# Patient Record
Sex: Male | Born: 1950 | Race: Black or African American | Hispanic: No | Marital: Single | Smoking: Never smoker
Health system: Southern US, Community
[De-identification: ages and names within clinical notes are randomized; demographics above are authoritative.]

## PROBLEM LIST (undated history)

## (undated) DIAGNOSIS — E119 Type 2 diabetes mellitus without complications: Secondary | ICD-10-CM

## (undated) DIAGNOSIS — I639 Cerebral infarction, unspecified: Secondary | ICD-10-CM

---

## 2016-11-02 ENCOUNTER — Emergency Department (HOSPITAL_COMMUNITY): Payer: Self-pay

## 2016-11-02 ENCOUNTER — Inpatient Hospital Stay (HOSPITAL_COMMUNITY)
Admission: EM | Admit: 2016-11-02 | Discharge: 2016-11-06 | DRG: 684 | Disposition: A | Discharge status: Home / Self Care | Payer: Self-pay | Attending: Internal Medicine | Admitting: Internal Medicine

## 2016-11-02 ENCOUNTER — Encounter (HOSPITAL_COMMUNITY): Payer: Self-pay | Admitting: Emergency Medicine

## 2016-11-02 DIAGNOSIS — Z888 Allergy status to other drugs, medicaments and biological substances status: Secondary | ICD-10-CM

## 2016-11-02 DIAGNOSIS — E119 Type 2 diabetes mellitus without complications: Secondary | ICD-10-CM | POA: Diagnosis present

## 2016-11-02 DIAGNOSIS — N429 Disorder of prostate, unspecified: Secondary | ICD-10-CM | POA: Diagnosis present

## 2016-11-02 DIAGNOSIS — K5900 Constipation, unspecified: Secondary | ICD-10-CM

## 2016-11-02 DIAGNOSIS — R112 Nausea with vomiting, unspecified: Secondary | ICD-10-CM

## 2016-11-02 DIAGNOSIS — E876 Hypokalemia: Secondary | ICD-10-CM | POA: Diagnosis present

## 2016-11-02 DIAGNOSIS — Z6821 Body mass index (BMI) 21.0-21.9, adult: Secondary | ICD-10-CM

## 2016-11-02 DIAGNOSIS — N179 Acute kidney failure, unspecified: Principal | ICD-10-CM

## 2016-11-02 DIAGNOSIS — R634 Abnormal weight loss: Secondary | ICD-10-CM

## 2016-11-02 DIAGNOSIS — I10 Essential (primary) hypertension: Secondary | ICD-10-CM | POA: Diagnosis present

## 2016-11-02 DIAGNOSIS — Z79899 Other long term (current) drug therapy: Secondary | ICD-10-CM

## 2016-11-02 DIAGNOSIS — N400 Enlarged prostate without lower urinary tract symptoms: Secondary | ICD-10-CM

## 2016-11-02 DIAGNOSIS — Z8673 Personal history of transient ischemic attack (TIA), and cerebral infarction without residual deficits: Secondary | ICD-10-CM

## 2016-11-02 DIAGNOSIS — R630 Anorexia: Secondary | ICD-10-CM

## 2016-11-02 DIAGNOSIS — E860 Dehydration: Secondary | ICD-10-CM

## 2016-11-02 HISTORY — DX: Type 2 diabetes mellitus without complications: E11.9

## 2016-11-02 HISTORY — DX: Cerebral infarction, unspecified: I63.9

## 2016-11-02 LAB — COMPREHENSIVE METABOLIC PANEL
ALT: 17 U/L (ref 17–63)
AST: 17 U/L (ref 15–41)
Albumin: 4.5 g/dL (ref 3.5–5.0)
Alkaline Phosphatase: 88 U/L (ref 38–126)
Anion gap: 16 — ABNORMAL HIGH (ref 5–15)
BUN: 28 mg/dL — ABNORMAL HIGH (ref 6–20)
CHLORIDE: 97 mmol/L — AB (ref 101–111)
CO2: 25 mmol/L (ref 22–32)
Calcium: 11.1 mg/dL — ABNORMAL HIGH (ref 8.9–10.3)
Creatinine, Ser: 1.64 mg/dL — ABNORMAL HIGH (ref 0.61–1.24)
GFR calc Af Amer: 49 mL/min — ABNORMAL LOW (ref >60)
GFR, EST NON AFRICAN AMERICAN: 43 mL/min — AB (ref >60)
Glucose, Bld: 118 mg/dL — ABNORMAL HIGH (ref 65–99)
POTASSIUM: 3.5 mmol/L (ref 3.5–5.1)
SODIUM: 138 mmol/L (ref 135–145)
Total Bilirubin: 1.3 mg/dL — ABNORMAL HIGH (ref 0.3–1.2)
Total Protein: 8.5 g/dL — ABNORMAL HIGH (ref 6.5–8.1)

## 2016-11-02 LAB — CBC
HEMATOCRIT: 39.5 % (ref 39.0–52.0)
Hemoglobin: 14.6 g/dL (ref 13.0–17.0)
MCH: 29.6 pg (ref 26.0–34.0)
MCHC: 37 g/dL — ABNORMAL HIGH (ref 30.0–36.0)
MCV: 80.1 fL (ref 78.0–100.0)
Platelets: 282 10*3/uL (ref 150–400)
RBC: 4.93 MIL/uL (ref 4.22–5.81)
RDW: 13.7 % (ref 11.5–15.5)
WBC: 8.2 10*3/uL (ref 4.0–10.5)

## 2016-11-02 LAB — I-STAT TROPONIN, ED: Troponin i, poc: 0 ng/mL (ref 0.00–0.08)

## 2016-11-02 LAB — LIPASE, BLOOD: Lipase: 29 U/L (ref 11–51)

## 2016-11-02 LAB — I-STAT CG4 LACTIC ACID, ED: Lactic Acid, Venous: 1.89 mmol/L (ref 0.5–1.9)

## 2016-11-02 MED ORDER — ONDANSETRON HCL 4 MG/2ML IJ SOLN
4.0000 mg | Freq: Once | INTRAMUSCULAR | Status: AC
  Administered 2016-11-02: 4 mg via INTRAVENOUS
  Filled 2016-11-02: qty 2

## 2016-11-02 MED ORDER — SODIUM CHLORIDE 0.9 % IV BOLUS (SEPSIS)
1000.0000 mL | Freq: Once | INTRAVENOUS | Status: AC
  Administered 2016-11-02: 1000 mL via INTRAVENOUS

## 2016-11-02 MED ORDER — IOPAMIDOL (ISOVUE-300) INJECTION 61%
INTRAVENOUS | Status: AC
  Administered 2016-11-02: 75 mL via INTRAVENOUS
  Filled 2016-11-02: qty 75

## 2016-11-02 NOTE — ED Notes (Signed)
Pt has not voided yet

## 2016-11-02 NOTE — ED Notes (Signed)
Provided pt with a urinal 

## 2016-11-02 NOTE — ED Provider Notes (Signed)
[facility]-EMERGENCY DEPT Provider Note   CSN: [id number] Arrival date & time: 11/02/16  2103     History   Chief Complaint Chief Complaint  Patient presents with  . Emesis    HPI Taylor Scott is a 66 y.o. male with a PMHx of HTN, HLD, DM2, and recent CVA (05/19/16, at [facility] who presents to the ED with complaints of nausea and vomiting that began at 8 PM after he had a drink of cold water. Patient states prior to that he was feeling fine. He states that the emesis was nonbloody and nonbilious. No known aggravating factors, no treatments tried prior to arrival. Later on during the evaluation, patient's family arrived and informed me that he has not had much of an appetite over the last several months ever since his CVA, and has had occasional nausea and vomiting for at least the last several weeks. Patient states that it's been about 2 weeks since he has not had an appetite, and he has not had a bowel movement in 3 weeks due to poor by mouth intake. He reports a 40 pound weight loss since April. He states that he didn't like how his medications made him feel so 2 weeks ago he stopped taking them. His medications include metformin  BID, losartan  QD, and atorvastatin  QHS. His PCP is Dr. Minda Meo at [facility].   He denies fevers, chills, night sweats, CP, SOB, abd pain, diarrhea, obstipation, melena, hematochezia, hematemesis, hematuria, dysuria, myalgias, arthralgias, numbness, tingling, focal weakness, or any other complaints at this time. Denies recent travel, sick contacts, suspicious food intake, EtOH use, NSAID use, or prior abd surgeries.    The history is provided by the patient, medical records and a relative. No language interpreter was used.  Emesis   This is a new problem. The current episode started 3 to 5 hours ago. Episode frequency: once. The problem has not changed since onset.The emesis has an appearance of stomach contents. There has been no fever. Pertinent  negatives include no abdominal pain, no arthralgias, no chills, no diarrhea, no fever and no myalgias.    Past Medical History:  Diagnosis Date  . Diabetes mellitus without complication (HCC)   . Stroke [facility])     There are no active problems to display for this patient.   History reviewed. No pertinent surgical history.     Home Medications    Prior to Admission medications   Not on File    Family History History reviewed. No pertinent family history.  Social History Social History  Substance Use Topics  . Smoking status: Never Smoker  . Smokeless tobacco: Never Used  . Alcohol use No     Allergies   Lisinopril   Review of Systems Review of Systems  Constitutional: Positive for appetite change and unexpected weight change. Negative for chills and fever.  Respiratory: Negative for shortness of breath.   Cardiovascular: Negative for chest pain.  Gastrointestinal: Positive for constipation, nausea and vomiting. Negative for abdominal pain, anal bleeding, blood in stool and diarrhea.  Genitourinary: Negative for dysuria and hematuria.  Musculoskeletal: Negative for arthralgias and myalgias.  Skin: Negative for color change.  Allergic/Immunologic: Positive for immunocompromised state (DM2).  Neurological: Negative for weakness and numbness.  Psychiatric/Behavioral: Negative for confusion.   All other systems reviewed and are negative for acute change except as noted in the HPI.    Physical Exam Updated Vital Signs BP (!) 118/91 (BP Location: Left Arm)   Pulse 87  Temp 98.1 F (36.7 C) (Oral)   Resp 18   SpO2 96%   Physical Exam  Constitutional: He is oriented to person, place, and time. Vital signs are normal. He appears well-developed. He appears cachectic.  Non-toxic appearance. No distress.  Afebrile, nontoxic, NAD, appears older than stated age, cachetic appearing  HENT:  Head: Normocephalic and atraumatic.  Mouth/Throat: Oropharynx is clear and  moist. Mucous membranes are dry.  Dry lips  Eyes: Conjunctivae and EOM are normal. Right eye exhibits no discharge. Left eye exhibits no discharge.  Neck: Normal range of motion. Neck supple.  Cardiovascular: Normal rate, regular rhythm, normal heart sounds and intact distal pulses.  Exam reveals no gallop and no friction rub.   No murmur heard. Pulmonary/Chest: Effort normal and breath sounds normal. No respiratory distress. He has no decreased breath sounds. He has no wheezes. He has no rhonchi. He has no rales.  Abdominal: Soft. Normal appearance and bowel sounds are normal. He exhibits no distension. There is tenderness in the suprapubic area. There is no rigidity, no rebound, no guarding, no CVA tenderness, no tenderness at McBurney's point and negative Murphy's sign.  Soft, scaphoid abdomen, nondistended, +BS throughout, with very mild suprapubic TTP to deep palpation, no r/g/r, neg murphy's, neg mcburney's, no CVA TTP   Musculoskeletal: Normal range of motion.  Neurological: He is alert and oriented to person, place, and time. He has normal strength. No sensory deficit.  Skin: Skin is warm, dry and intact. No rash noted.  Psychiatric: He has a normal mood and affect.  Nursing note and vitals reviewed.    ED Treatments / Results  Labs (all labs ordered are listed, but only abnormal results are displayed) Labs Reviewed  COMPREHENSIVE METABOLIC PANEL - Abnormal; Notable for the following:       Result Value   Chloride 97 (*)    Glucose, Bld 118 (*)    BUN 28 (*)    Creatinine, Ser 1.64 (*)    Calcium 11.1 (*)    Total Protein 8.5 (*)    Total Bilirubin 1.3 (*)    GFR calc non Af Amer 43 (*)    GFR calc Af Amer 49 (*)    Anion gap 16 (*)    All other components within normal limits  CBC - Abnormal; Notable for the following:    MCHC 37.0 (*)    All other components within normal limits  LIPASE, BLOOD  URINALYSIS, ROUTINE W REFLEX MICROSCOPIC  I-STAT CG4 LACTIC ACID, ED    I-STAT TROPONIN, ED    EKG  EKG Interpretation  Date/Time:  Saturday November 02 2016 23:23:24 EDT Ventricular Rate:  52 PR Interval:    QRS Duration: 95 QT Interval:  441 QTC Calculation: 411 R Axis:   76 Text Interpretation:  Sinus rhythm ST elevation, consider lateral injury No prior ECG for comparison.  No STEMI Confirmed by Theda Belfast ([id number]) on 11/02/2016 11:33:41 PM       Radiology Ct Abdomen Pelvis W Contrast  Result Date: 11/02/2016 CLINICAL DATA:  Nausea and vomiting. Lower abdominal tenderness. No bowel movement for 3 weeks. Weight loss. EXAM: CT ABDOMEN AND PELVIS WITH CONTRAST TECHNIQUE: Multidetector CT imaging of the abdomen and pelvis was performed using the standard protocol following bolus administration of intravenous contrast. CONTRAST:  75 cc Isovue 300 IV COMPARISON:  None. FINDINGS: Lower chest: The lung bases are clear. No pleural fluid or consolidation. No pulmonary nodule. Hepatobiliary: No focal liver abnormality is seen. No gallstones,  gallbladder wall thickening, or biliary dilatation. Pancreas: No ductal dilatation or inflammation. No evidence of pancreatic mass, mild motion artifact through the head and neck. Spleen: Normal in size without focal abnormality. Adrenals/Urinary Tract: Normal adrenal glands without nodule. No hydronephrosis or perinephric edema. There small subcentimeter hypodense lesions in both kidneys that are too small for accurate characterization about the likely simple cysts. No suspicious features. Urinary bladder is minimally distended, mild thick wall. Stomach/Bowel: Lack of enteric contrast and paucity of intra-abdominal fat limits bowel assessment. No gastric wall thickening. No small bowel inflammation or obstruction. Normal appendix. Moderate colonic stool burden in the ascending, transverse, and descending colon. Tortuous sigmoid colon with small volume of stool. No colonic wall thickening. No evidence of dominant colonic mass  allowing for lack of contrast. Vascular/Lymphatic: No significant vascular findings are present. No enlarged abdominal or pelvic lymph nodes. Reproductive: Heterogeneous enlarged prostate gland spanning 5.8 x 5.1 x 5.3 cm there is mass effect on the bladder base. Other: No ascites.  No free air.  No intra-abdominal abscess. Musculoskeletal: No acute osseous abnormality. No focal bone lesion. IMPRESSION: 1. No bowel obstruction or inflammation. Moderate colonic stool burden. 2. Heterogeneous enlarged prostate gland causing mass effect on the bladder base. Recommend correlation with PSA. Mild bladder wall thickening is likely chronic. 3. There is otherwise no evidence of intra-abdominal malignancy. 4. Subcentimeter bilateral renal cysts. Electronically Signed   By: Rubye Oaks M.D.   On: 11/02/2016 23:30    Procedures Procedures (including critical care time)  Medications Ordered in ED Medications  sodium chloride 0.9 % bolus 1,000 mL (0 mLs Intravenous Stopped 11/02/16 2359)  ondansetron (ZOFRAN) injection 4 mg (4 mg Intravenous Given 11/02/16 2254)  iopamidol (ISOVUE-300) 61 % injection (75 mLs Intravenous Contrast Given 11/02/16 2301)  sodium chloride 0.9 % bolus 1,000 mL (1,000 mLs Intravenous New Bag/Given 11/03/16 0121)     Initial Impression / Assessment and Plan / ED Course  I have reviewed the triage vital signs and the nursing notes.  Pertinent labs & imaging results that were available during my care of the patient were reviewed by me and considered in my medical decision making (see chart for details).     66 y.o. male here with n/v x1 tonight, but then family states he hasn't been eating for months and had n/v off and on for months. Loss of appetite, no BM x3wks, and 40#wt loss. On exam, cachectic appearing, appears older than stated age, scaphoid abdomen, mild lower abd TTP to deep palpation but nonperitoneal, adequate bowel sounds throughout. Lips appear dry.  CBC WNL, lipase WNL,  CMP with BUN 28/Cr 1.64 which is up from Aug 2018 (in care everywhere; Cr 1.16, BUN 16), and with bili 1.3 and anion gap 16 (likely from starvation/malnutrition). Will get lactic, trop, EKG, and CT abd/pelv. Will give fluids and zofran, pt declines wanting pain meds. Will reassess shortly. Discussed case with my attending Dr. Rush Landmark who agrees with plan.  1:28 AM Lactic WNL. Trop neg. EKG with early repol appearance, no ischemic findings. CT A/P with hetergeneous enlarged prostate causing mass effect on bladder base and mild bladder wall thickening; also with moderate colonic stool burden but no fecal impaction on CT. I'm concerned that he potentially has a malignancy of the prostate that could be causing some of his symptoms. States nausea somewhat better after zofran. After 1L bolus, pt still hasn't urinated; bladder scan shows ; will repeat another liter but will admit for AKI and further work  up.   2:01 AM Dr. Antionette Char of [facility] returning page and will admit. Holding orders to be placed by admitting team. Please see their notes for further documentation of care. I appreciate their help with this pleasant pt's care. Pt stable at time of admission.     Final Clinical Impressions(s) / ED Diagnoses   Final diagnoses:  AKI (acute kidney injury) (HCC)  Dehydration  Loss of appetite  Weight loss  Enlarged prostate  Nausea and vomiting in adult patient  Constipation, unspecified constipation type    New Prescriptions New Prescriptions   No medications on [address] [place], [place] 11/03/16 0825    Tegeler, Canary Brim, MD 11/04/16 [phone]

## 2016-11-02 NOTE — ED Triage Notes (Signed)
Patient complaining of vomiting. Patient called ems. Patient has vomited one time in the last hour Per EMS.

## 2016-11-02 NOTE — ED Notes (Signed)
Bed: [facility] Expected date:  Expected time:  Means of arrival:  Comments: 

## 2016-11-02 NOTE — ED Notes (Signed)
No respiratory or acute distress noted resting in bed alert and oriented x 3 call light in reach visitors at bedside.

## 2016-11-03 ENCOUNTER — Observation Stay (HOSPITAL_COMMUNITY): Payer: Self-pay

## 2016-11-03 ENCOUNTER — Encounter (HOSPITAL_COMMUNITY): Payer: Self-pay | Admitting: Family Medicine

## 2016-11-03 DIAGNOSIS — N400 Enlarged prostate without lower urinary tract symptoms: Secondary | ICD-10-CM

## 2016-11-03 DIAGNOSIS — R112 Nausea with vomiting, unspecified: Secondary | ICD-10-CM | POA: Diagnosis present

## 2016-11-03 DIAGNOSIS — N179 Acute kidney failure, unspecified: Secondary | ICD-10-CM | POA: Diagnosis present

## 2016-11-03 DIAGNOSIS — R630 Anorexia: Secondary | ICD-10-CM

## 2016-11-03 DIAGNOSIS — N429 Disorder of prostate, unspecified: Secondary | ICD-10-CM

## 2016-11-03 DIAGNOSIS — Z8673 Personal history of transient ischemic attack (TIA), and cerebral infarction without residual deficits: Secondary | ICD-10-CM

## 2016-11-03 DIAGNOSIS — I10 Essential (primary) hypertension: Secondary | ICD-10-CM

## 2016-11-03 LAB — COMPREHENSIVE METABOLIC PANEL
ALK PHOS: 68 U/L (ref 38–126)
ALT: 14 U/L — ABNORMAL LOW (ref 17–63)
ANION GAP: 11 (ref 5–15)
AST: 13 U/L — ABNORMAL LOW (ref 15–41)
Albumin: 3.8 g/dL (ref 3.5–5.0)
BUN: 27 mg/dL — ABNORMAL HIGH (ref 6–20)
CALCIUM: 9.5 mg/dL (ref 8.9–10.3)
CO2: 24 mmol/L (ref 22–32)
Chloride: 105 mmol/L (ref 101–111)
Creatinine, Ser: 1.36 mg/dL — ABNORMAL HIGH (ref 0.61–1.24)
GFR calc non Af Amer: 53 mL/min — ABNORMAL LOW (ref >60)
Glucose, Bld: 93 mg/dL (ref 65–99)
Potassium: 3.6 mmol/L (ref 3.5–5.1)
SODIUM: 140 mmol/L (ref 135–145)
Total Bilirubin: 0.8 mg/dL (ref 0.3–1.2)
Total Protein: 6.6 g/dL (ref 6.5–8.1)

## 2016-11-03 LAB — URINALYSIS, ROUTINE W REFLEX MICROSCOPIC
Glucose, UA: NEGATIVE mg/dL
Ketones, ur: 40 mg/dL — AB
Leukocytes, UA: NEGATIVE
NITRITE: NEGATIVE
Specific Gravity, Urine: 1.01 (ref 1.005–1.030)
pH: 5.5 (ref 5.0–8.0)

## 2016-11-03 LAB — URINALYSIS, MICROSCOPIC (REFLEX): BACTERIA UA: NONE SEEN

## 2016-11-03 LAB — GLUCOSE, CAPILLARY
GLUCOSE-CAPILLARY: 86 mg/dL (ref 65–99)
GLUCOSE-CAPILLARY: 95 mg/dL (ref 65–99)
Glucose-Capillary: 84 mg/dL (ref 65–99)
Glucose-Capillary: 163 mg/dL — ABNORMAL HIGH (ref 65–99)
Glucose-Capillary: 69 mg/dL (ref 65–99)

## 2016-11-03 LAB — HIV ANTIBODY (ROUTINE TESTING W REFLEX): HIV SCREEN 4TH GENERATION: NONREACTIVE

## 2016-11-03 MED ORDER — SENNOSIDES-DOCUSATE SODIUM 8.6-50 MG PO TABS: 1.0000 | ORAL_TABLET | Freq: Every evening | ORAL | Status: DC | PRN

## 2016-11-03 MED ORDER — HEPARIN SODIUM (PORCINE) 5000 UNIT/ML IJ SOLN
5000.0000 [IU] | Freq: Three times a day (TID) | INTRAMUSCULAR | Status: DC
  Administered 2016-11-03 – 2016-11-05 (×6): 5000 [IU] via SUBCUTANEOUS
  Filled 2016-11-03 (×7): qty 1

## 2016-11-03 MED ORDER — ACETAMINOPHEN 650 MG RE SUPP
650.0000 mg | Freq: Four times a day (QID) | RECTAL | Status: DC | PRN
Start: 2016-11-03 — End: 2016-11-06

## 2016-11-03 MED ORDER — ACETAMINOPHEN 325 MG PO TABS: 650.0000 mg | ORAL_TABLET | Freq: Four times a day (QID) | ORAL | Status: DC | PRN

## 2016-11-03 MED ORDER — ONDANSETRON HCL 4 MG PO TABS: 4.0000 mg | ORAL_TABLET | Freq: Four times a day (QID) | ORAL | Status: DC | PRN

## 2016-11-03 MED ORDER — ONDANSETRON HCL 4 MG/2ML IJ SOLN
4.0000 mg | Freq: Four times a day (QID) | INTRAMUSCULAR | Status: DC | PRN
  Administered 2016-11-03: 4 mg via INTRAVENOUS

## 2016-11-03 MED ORDER — HYDROCODONE-ACETAMINOPHEN 5-325 MG PO TABS: 1.0000 | ORAL_TABLET | ORAL | Status: DC | PRN

## 2016-11-03 MED ORDER — SODIUM CHLORIDE 0.9 % IV BOLUS (SEPSIS)
1000.0000 mL | Freq: Once | INTRAVENOUS | Status: AC
  Administered 2016-11-03: 1000 mL via INTRAVENOUS

## 2016-11-03 MED ORDER — ATORVASTATIN CALCIUM 40 MG PO TABS
80.0000 mg | ORAL_TABLET | Freq: Every day | ORAL | Status: DC
  Administered 2016-11-03 – 2016-11-05 (×3): 80 mg via ORAL
  Filled 2016-11-03 (×3): qty 2

## 2016-11-03 MED ORDER — INSULIN ASPART 100 UNIT/ML ~~LOC~~ SOLN
0.0000 [IU] | Freq: Three times a day (TID) | SUBCUTANEOUS | Status: DC
  Administered 2016-11-03 – 2016-11-04 (×2): 2 [IU] via SUBCUTANEOUS
  Administered 2016-11-05: 1 [IU] via SUBCUTANEOUS

## 2016-11-03 MED ORDER — SODIUM CHLORIDE 0.9 % IV SOLN
INTRAVENOUS | Status: AC
  Administered 2016-11-03 (×2): via INTRAVENOUS

## 2016-11-03 MED ORDER — BISACODYL 5 MG PO TBEC: 5.0000 mg | DELAYED_RELEASE_TABLET | Freq: Every day | ORAL | Status: DC | PRN

## 2016-11-03 NOTE — Progress Notes (Addendum)
Patient seen and evaluated earlier the same by my associate. Please refer to H&P for details regarding history, assessment, and plan. Patient is currently not nauseous and feels a little better. He seems to have a flat affect and does not provide much information.  Gen.: Patient in no acute distress, alert and awake Cardiovascular: No cyanosis Pulmonary: No increased work of breathing, no audible wheezes  Agree with continuing IV fluids and will plan on reassessing next a.m.  Malin Cervini  Serum creatinine improved on reassessment.  Addendum: Reportedly patient had stroke roughly a month ago. Family states the patient has been confused and slow to respond which is a change in neurological condition. They're requesting further workup given his history of stroke concern is for recurrence. Will order CT scan of the head without contrast.

## 2016-11-03 NOTE — H&P (Signed)
History and Physical    Taylor Scott ZOX:[id number] DOB: 31-May-1950 DOA: 11/02/2016  PCP: Patient, No Pcp Per Shirley Muscat, NP  Patient coming from: Home   Chief Complaint: Nausea, vomiting, gen weakness  HPI: Sabastien Scott is a 66 y.o. 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 Patient is accompanied by family who assists with the history. Patient reports he has had some nausea with non bloody vomiting for the past day or so, but family reports that it has been going on for close to a month. There is no significant abdominal pain, diarrhea, melena, or hematochezia. No chest pain or palpitations. No headache, change in vision or hearing, or new focal numbness or weakness. He was seen by his PCP less than a month ago and seemed to be dehydrated at the time, reportedly not drinking very much water.  ED Course: Upon arrival to the ED, patient is found to be afebrile, saturating well on room air, and with vitals otherwise stable. EKG features a sinus rhythm with nonspecific ST-T changes in the lateral leads. Chemistry panels notable for a BUN of 28, and creatinine 1.64, up from 1.16 three weeks ago. CBC is unremarkable. Lactic acid is reassuring at 1.89. Troponin is undetectable. Urinalysis has been ordered and remains pending. CT abdomen and pelvis is negative for obstruction or inflammation, but notable for heterogenous enlargement of the prostate causing mass effect on the bladder.Patient was treated with 2 L normal saline and Zofran in the ED. He remained hemodynamically stable, and no apparent respirator distress, and will be observed on the medical-surgical unit for ongoing evaluation and management of acute kidney injury, likely prerenal and secondary to nausea and vomiting.   Review of Systems:  All  other systems reviewed and apart from HPI, are negative.  Past Medical History:  Diagnosis Date  . Diabetes mellitus without complication (HCC)   . Stroke [facility])     History reviewed. No pertinent surgical history.   reports that he has never smoked. He has never used smokeless tobacco. He reports that he does not drink alcohol or use drugs.  Allergies  Allergen Reactions  . Lisinopril Anaphylaxis    History reviewed. No pertinent family history.   Prior to Admission medications   Medication Sig Start Date End Date Taking? Authorizing Provider  atorvastatin (LIPITOR) 80 MG tablet Take 80 mg by mouth daily. 10/07/16  Yes [provider]  losartan (COZAAR) 25 MG tablet Take 25 mg by mouth daily. 10/07/16  Yes [provider]    Physical Exam: Vitals:   11/02/16 2117 11/02/16 2328 11/03/16 0030  BP: (!) 118/91 127/78 125/83  Pulse: 87 (!) 52 (!) 55  Resp: Temp: 98.1 F (36.7 C)    TempSrc: Oral    SpO2: 96% 100% 99%      Constitutional: NAD, calm, listless Eyes: PERTLA, lids and conjunctivae normal ENMT: Mucous membranes are dry. Posterior pharynx clear of any exudate or lesions.   Neck: normal, supple, no masses, no thyromegaly Respiratory: clear to auscultation bilaterally, no wheezing, no crackles. Normal respiratory effort.  Cardiovascular: S1 & S2 heard, regular rate and rhythm. No extremity edema. No significant JVD. Abdomen: No distension, no tenderness, soft. Bowel sounds active.  Musculoskeletal: no clubbing / cyanosis. No joint deformity upper and lower extremities.   Skin: no significant rashes, lesions,  ulcers. Poor turgor. Neurologic: No gross facial asymmetry, PERRL, EOMI. Sensation intact, moving all extremities. ?expressive aphasia vs poor cooperation.   Psychiatric: Difficult to assess given the clinical scenario.     Labs on Admission: I have personally reviewed following labs and imaging studies  CBC:  Recent Labs Lab  11/02/16 2116  WBC 8.2  HGB 14.6  HCT 39.5  MCV 80.1  PLT 282   Basic Metabolic Panel:  Recent Labs Lab 11/02/16 2116  NA 138  K 3.5  CL 97*  CO2 25  GLUCOSE 118*  BUN 28*  CREATININE 1.64*  CALCIUM 11.1*   GFR: CrCl cannot be calculated (Unknown ideal weight.). Liver Function Tests:  Recent Labs Lab 11/02/16 2116  AST 17  ALT 17  ALKPHOS 88  BILITOT 1.3*  PROT 8.5*  ALBUMIN 4.5    Recent Labs Lab 11/02/16 2116  LIPASE 29   No results for input(s): AMMONIA in the last 168 hours. Coagulation Profile: No results for input(s): INR, PROTIME in the last 168 hours. Cardiac Enzymes: No results for input(s): CKTOTAL, CKMB, CKMBINDEX, TROPONINI in the last 168 hours. BNP (last 3 results) No results for input(s): PROBNP in the last 8760 hours. HbA1C: No results for input(s): HGBA1C in the last 72 hours. CBG: No results for input(s): GLUCAP in the last 168 hours. Lipid Profile: No results for input(s): CHOL, HDL, LDLCALC, TRIG, CHOLHDL, LDLDIRECT in the last 72 hours. Thyroid Function Tests: No results for input(s): TSH, T4TOTAL, FREET4, T3FREE, THYROIDAB in the last 72 hours. Anemia Panel: No results for input(s): VITAMINB12, FOLATE, FERRITIN, TIBC, IRON, RETICCTPCT in the last 72 hours. Urine analysis: No results found for: COLORURINE, APPEARANCEUR, LABSPEC, PHURINE, GLUCOSEU, HGBUR, BILIRUBINUR, KETONESUR, PROTEINUR, UROBILINOGEN, NITRITE, LEUKOCYTESUR Sepsis Labs: (procalcitonin:4,lacticidven:4) )No results found for this or any previous visit (from the past 240 hour(s)).   Radiological Exams on Admission: Ct Abdomen Pelvis W Contrast  Result Date: 11/02/2016 CLINICAL DATA:  Nausea and vomiting. Lower abdominal tenderness. No bowel movement for 3 weeks. Weight loss. EXAM: CT ABDOMEN AND PELVIS WITH CONTRAST TECHNIQUE: Multidetector CT imaging of the abdomen and pelvis was performed using the standard protocol following bolus administration of  intravenous contrast. CONTRAST:  75 cc Isovue 300 IV COMPARISON:  None. FINDINGS: Lower chest: The lung bases are clear. No pleural fluid or consolidation. No pulmonary nodule. Hepatobiliary: No focal liver abnormality is seen. No gallstones, gallbladder wall thickening, or biliary dilatation. Pancreas: No ductal dilatation or inflammation. No evidence of pancreatic mass, mild motion artifact through the head and neck. Spleen: Normal in size without focal abnormality. Adrenals/Urinary Tract: Normal adrenal glands without nodule. No hydronephrosis or perinephric edema. There small subcentimeter hypodense lesions in both kidneys that are too small for accurate characterization about the likely simple cysts. No suspicious features. Urinary bladder is minimally distended, mild thick wall. Stomach/Bowel: Lack of enteric contrast and paucity of intra-abdominal fat limits bowel assessment. No gastric wall thickening. No small bowel inflammation or obstruction. Normal appendix. Moderate colonic stool burden in the ascending, transverse, and descending colon. Tortuous sigmoid colon with small volume of stool. No colonic wall thickening. No evidence of dominant colonic mass allowing for lack of contrast. Vascular/Lymphatic: No significant vascular findings are present. No enlarged abdominal or pelvic lymph nodes. Reproductive: Heterogeneous enlarged prostate gland spanning 5.8 x 5.1 x 5.3 cm there is mass effect on the bladder base. Other: No ascites.  No free air.  No intra-abdominal abscess. Musculoskeletal: No acute osseous abnormality. No focal bone lesion. IMPRESSION:  1. No bowel obstruction or inflammation. Moderate colonic stool burden. 2. Heterogeneous enlarged prostate gland causing mass effect on the bladder base. Recommend correlation with PSA. Mild bladder wall thickening is likely chronic. 3. There is otherwise no evidence of intra-abdominal malignancy. 4. Subcentimeter bilateral renal cysts. Electronically  Signed   By: Rubye Oaks M.D.   On: 11/02/2016 23:30    EKG: Independently reviewed. Sinus rhythm, non-specific ST-T abnormality in lateral leads.   Assessment/Plan  1. Acute kidney injury  - Pt presents with nausea, vomiting, poor appetite  - Found to have mild AKI with SCr of 1.64, up from 1.16 less than a month ago   - No hydro on CT abd/pelvis - Likely prerenal given the circumstances with poor appetite and N/V  - He was treated with 2 liters NS in ED and will be continued on IVF hydration  - Hold losartan and repeat chem panel in am    2. Nausea, vomiting  - Uncertain etiology, abd exam in benign and CT abd/pelvis without obstruction or inflammation - Continue IVF and prn antiemetics, monitor lytes    3. History of CVA  - No new deficits identified  - Continue Lipitor, hold losartan until renal fxn stabilizes   4. Hypertension  - BP is at goal - Losartan held on admission in setting of AKI  - Monitor and treat with prn hydralazine IVP's  - Resume losartan when renal function stabilizes    5. Prostate enlargement  - Incidental notation made on CT abd/pelvis of a heterogeneously enlarged prostate gland  - No hydro on CT  - Outpatient follow-up recommended with PSA measurement     6. Type II DM  - A1c was 5.9% in July 2018  - Has been diet-controlled lately  - Follow CBG's and use a low-intensity SSI with Novolog as needed    DVT prophylaxis: sq heparin  Code Status: Full  Family Communication: Family updated at bedside Disposition Plan: Observe on med-surg Consults called: None Admission status: Observation    Briscoe Deutscher, MD [facility] Pager [phone]  If 7PM-7AM, please contact night-coverage [email or web address] Password [email or web address]  11/03/2016, 2:05 AM

## 2016-11-03 NOTE — ED Notes (Signed)
Informed pt again that we need a urine specimen, no respiratory or acute distress noted alert and oriented x 3 call light in reach.

## 2016-11-03 NOTE — ED Notes (Signed)
Pt has not voided.  

## 2016-11-03 NOTE — Progress Notes (Signed)
Pt feels like gagging when he tries to eat.

## 2016-11-04 LAB — BASIC METABOLIC PANEL
Anion gap: 5 (ref 5–15)
BUN: 18 mg/dL (ref 6–20)
CALCIUM: 9.3 mg/dL (ref 8.9–10.3)
CO2: 27 mmol/L (ref 22–32)
Chloride: 107 mmol/L (ref 101–111)
Creatinine, Ser: 1.2 mg/dL (ref 0.61–1.24)
GFR calc Af Amer: >60 mL/min (ref >60)
GLUCOSE: 125 mg/dL — AB (ref 65–99)
Potassium: 3.3 mmol/L — ABNORMAL LOW (ref 3.5–5.1)
Sodium: 139 mmol/L (ref 135–145)

## 2016-11-04 LAB — GLUCOSE, CAPILLARY
GLUCOSE-CAPILLARY: 97 mg/dL (ref 65–99)
GLUCOSE-CAPILLARY: 154 mg/dL — AB (ref 65–99)
Glucose-Capillary: 110 mg/dL — ABNORMAL HIGH (ref 65–99)
Glucose-Capillary: 103 mg/dL — ABNORMAL HIGH (ref 65–99)
Glucose-Capillary: 83 mg/dL (ref 65–99)

## 2016-11-04 MED ORDER — BISACODYL 5 MG PO TBEC: 5.0000 mg | DELAYED_RELEASE_TABLET | Freq: Every day | ORAL | Status: DC | PRN

## 2016-11-04 MED ORDER — UNJURY CHICKEN SOUP POWDER
2.0000 [oz_av] | Freq: Three times a day (TID) | ORAL | Status: DC
  Administered 2016-11-04 – 2016-11-06 (×6): 2 [oz_av] via ORAL
  Filled 2016-11-04 (×7): qty 27

## 2016-11-04 MED ORDER — POTASSIUM CHLORIDE CRYS ER 20 MEQ PO TBCR
20.0000 meq | EXTENDED_RELEASE_TABLET | Freq: Two times a day (BID) | ORAL | Status: DC
  Administered 2016-11-04 – 2016-11-06 (×5): 20 meq via ORAL
  Filled 2016-11-04 (×5): qty 1

## 2016-11-04 MED ORDER — BISACODYL 5 MG PO TBEC
5.0000 mg | DELAYED_RELEASE_TABLET | Freq: Once | ORAL | Status: AC
  Administered 2016-11-04: 5 mg via ORAL
  Filled 2016-11-04: qty 1

## 2016-11-04 MED ORDER — PRO-STAT SUGAR FREE PO LIQD
30.0000 mL | Freq: Two times a day (BID) | ORAL | Status: DC
  Administered 2016-11-04 – 2016-11-06 (×4): 30 mL via ORAL
  Filled 2016-11-04 (×4): qty 30

## 2016-11-04 NOTE — Progress Notes (Signed)
Inpatient Diabetes Program Recommendations  AACE/ADA: New Consensus Statement on Inpatient Glycemic Control (2015)  Target Ranges:  Prepandial:   less than 140 mg/dL      Peak postprandial:   less than 180 mg/dL (1-2 hours)      Critically ill patients:  140 - 180 mg/dL   Lab Results  Component Value Date   GLUCAP 154 (H) 11/04/2016    Review of Glycemic Control  Diabetes history: DM2 Outpatient Diabetes medications: previously on metformin Current orders for Inpatient glycemic control: Novolog 0-9 units tidwc  Consult for new diagnosis of DM.  Pt has been diagnosed with diabetes Type 2 for a couple of years but not taking any meds. At one time he was taking glipizide as well as metformin, but stopped taking d/t nausea. Has meter at home and checks blood sugars occasionally. Blood sugars usually < 180 mg/dL.  Would not send home on any diabetes meds at present. Pt instructed to go to a PCP and take logbook of blood sugars with him to appt.  No further questions. Pt voices understanding.  Will continue to follow while inpatient.  Thank you. Ailene Ards, RD, LDN, CDE Inpatient Diabetes Coordinator [phone]

## 2016-11-04 NOTE — Progress Notes (Signed)
PROGRESS NOTE    Taylor Scott  ZOX:[id number] DOB: [birth year] DOA: 11/02/2016 PCP: Patient, No Pcp Per    Brief Narrative:  66 y.o. 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   Assessment & Plan:   Principal Problem:   Acute kidney injury (HCC) -Resolved with IV fluid rehydration. CT scan of the head negative for acute stroke.  Active Problems: Hypokalemia - Replace orally and reassess next a.m.    History of completed stroke - Continue statin    Essential hypertension    Nausea & vomiting - Resolved in treating supportively - Patient reports difficulty keeping food down. This may be a viral gastric etiology.  - Speech therapy consulted and set patient was mild aspiration risk after their evaluation.  Constipation - Continue senna and Dulcolax    Prostate irregularity   Enlarged prostate   Loss of appetite - Agree with dietitian's evaluation, continue nutritional supplementation   DVT prophylaxis:/Heparin Code Status: Full Family Communication: None at bedside Disposition Plan: Discussed yesterday with daughter   Consultants:   None   Procedures: None   Antimicrobials: None   Subjective: Patient still reports poor oral intake  Objective: Vitals:   11/03/16 1405 11/03/16 2235 11/04/16 0500 11/04/16 1413  BP: 119/72 111/61 118/78 121/77  Pulse: (!) 56 (!) 51 (!) 50 (!) 51  Resp: Temp: 98.4 F (36.9 C) 98.1 F (36.7 C) 98.2 F (36.8 C) 98.3 F (36.8 C)  TempSrc: Oral Oral Oral Oral  SpO2: 100% 100% 100% 100%  Weight:   61.3 kg (135 lb 2.3 oz)   Height:    (1.676 m)     Intake/Output Summary (Last 24 hours) at 11/04/16 1627 Last data filed at 11/04/16 1030  Gross per 24 hour  Intake              200 ml  Output              500 ml   Net             -300 ml   Filed Weights   11/03/16 0300 11/04/16 0500  Weight: 61.3 kg (135 lb 2.3 oz) 61.3 kg (135 lb 2.3 oz)    Examination:  General exam: Appears calm and comfortable, in nad. Respiratory system: Clear to auscultation. Respiratory effort normal. Cardiovascular system: S1 & S2 heard, RRR. No JVD, murmurs, rubs, gallops or clicks.  Gastrointestinal system: Abdomen is nondistended, soft and nontender. No organomegaly or masses felt. Normal bowel sounds heard. Central nervous system: Alert and awake, no facial asymmetry. Answers questions appropriately Extremities: warm + pulses Skin: No rashes, lesions or ulcers, on limited exam. Psychiatry: flat affect     Data Reviewed: I have personally reviewed following labs and imaging studies  CBC:  Recent Labs Lab 11/02/16 2116  WBC 8.2  HGB 14.6  HCT 39.5  MCV 80.1  PLT 282   Basic Metabolic Panel:  Recent Labs Lab 11/02/16 2116 11/03/16 0354 11/04/16 0406  NA 138 140 139  K 3.5 3.6 3.3*  CL 97* 105 107  CO2 GLUCOSE 118* 93 125*  BUN 28* 27* 18  CREATININE 1.64* 1.36* 1.20  CALCIUM 11.1* 9.5 9.3   GFR: Estimated Creatinine Clearance: 53.9 mL/min (by C-G formula based on SCr of 1.2 mg/dL). Liver Function  Tests:  Recent Labs Lab 11/02/16 2116 11/03/16 0354  AST 17 13*  ALT 17 14*  ALKPHOS 88 68  BILITOT 1.3* 0.8  PROT 8.5* 6.6  ALBUMIN 4.5 3.8    Recent Labs Lab 11/02/16 2116  LIPASE 29   No results for input(s): AMMONIA in the last 168 hours. Coagulation Profile: No results for input(s): INR, PROTIME in the last 168 hours. Cardiac Enzymes: No results for input(s): CKTOTAL, CKMB, CKMBINDEX, TROPONINI in the last 168 hours. BNP (last 3 results) No results for input(s): PROBNP in the last 8760 hours. HbA1C: No results for input(s): HGBA1C in the last 72 hours. CBG:  Recent Labs Lab 11/03/16 2204 11/03/16 2350 11/04/16 0500 11/04/16 0722 11/04/16 1145  GLUCAP 69  86 110* 97 154*   Lipid Profile: No results for input(s): CHOL, HDL, LDLCALC, TRIG, CHOLHDL, LDLDIRECT in the last 72 hours. Thyroid Function Tests: No results for input(s): TSH, T4TOTAL, FREET4, T3FREE, THYROIDAB in the last 72 hours. Anemia Panel: No results for input(s): VITAMINB12, FOLATE, FERRITIN, TIBC, IRON, RETICCTPCT in the last 72 hours. Sepsis Labs:  Recent Labs Lab 11/02/16 2302  LATICACIDVEN 1.89    No results found for this or any previous visit (from the past 240 hour(s)).       Radiology Studies: Ct Head Wo Contrast  Result Date: 11/03/2016 CLINICAL DATA:  Vomiting, weakness and confusion. EXAM: CT HEAD WITHOUT CONTRAST TECHNIQUE: Contiguous axial images were obtained from the base of the skull through the vertex without intravenous contrast. COMPARISON:  Head CT 08/21/2016.  MRI 05/20/2016. FINDINGS: Brain: No acute finding. The brainstem and cerebellum are normal by CT. 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 There chronic small-vessel ischemic changes of the hemispheric white matter. No large vessel territory infarction. No mass lesion, hemorrhage, hydrocephalus or extra-axial collection. Vascular: Mild calcification of the carotid siphons. Skull: Normal Sinuses/Orbits: Clear/normal Other: None IMPRESSION: No acute finding by CT. Old small vessel infarctions affecting the basal ganglia bilaterally. Chronic small-vessel changes of the white matter. Electronically Signed   By: Paulina Fusi M.D.   On: 11/03/2016 14:08   Ct Abdomen Pelvis W Contrast  Result Date: 11/02/2016 CLINICAL DATA:  Nausea and vomiting. Lower abdominal tenderness. No bowel movement for 3 weeks. Weight loss. EXAM: CT ABDOMEN AND PELVIS WITH CONTRAST TECHNIQUE: Multidetector CT imaging of the abdomen and pelvis was performed using the standard protocol following bolus administration of intravenous contrast. CONTRAST:  75 cc Isovue 300 IV COMPARISON:  None.  FINDINGS: Lower chest: The lung bases are clear. No pleural fluid or consolidation. No pulmonary nodule. Hepatobiliary: No focal liver abnormality is seen. No gallstones, gallbladder wall thickening, or biliary dilatation. Pancreas: No ductal dilatation or inflammation. No evidence of pancreatic mass, mild motion artifact through the head and neck. Spleen: Normal in size without focal abnormality. Adrenals/Urinary Tract: Normal adrenal glands without nodule. No hydronephrosis or perinephric edema. There small subcentimeter hypodense lesions in both kidneys that are too small for accurate characterization about the likely simple cysts. No suspicious features. Urinary bladder is minimally distended, mild thick wall. Stomach/Bowel: Lack of enteric contrast and paucity of intra-abdominal fat limits bowel assessment. No gastric wall thickening. No small bowel inflammation or obstruction. Normal appendix. Moderate colonic stool burden in the ascending, transverse, and descending colon. Tortuous sigmoid colon with small volume of stool. No colonic wall thickening. No evidence of dominant colonic mass allowing for lack of contrast. Vascular/Lymphatic: No significant vascular findings are present. No enlarged abdominal  or pelvic lymph nodes. Reproductive: Heterogeneous enlarged prostate gland spanning 5.8 x 5.1 x 5.3 cm there is mass effect on the bladder base. Other: No ascites.  No free air.  No intra-abdominal abscess. Musculoskeletal: No acute osseous abnormality. No focal bone lesion. IMPRESSION: 1. No bowel obstruction or inflammation. Moderate colonic stool burden. 2. Heterogeneous enlarged prostate gland causing mass effect on the bladder base. Recommend correlation with PSA. Mild bladder wall thickening is likely chronic. 3. There is otherwise no evidence of intra-abdominal malignancy. 4. Subcentimeter bilateral renal cysts. Electronically Signed   By: Rubye Oaks M.D.   On: 11/02/2016 23:30     Scheduled  Meds: . atorvastatin  80 mg Oral q1800  . feeding supplement (PRO-STAT SUGAR FREE 64)  30 mL Oral BID  . heparin  5,000 Units Subcutaneous Q8H  . insulin aspart  0-9 Units Subcutaneous TID WC  . potassium chloride  20 mEq Oral BID  . protein supplement  2 oz Oral TID   Continuous Infusions:   LOS: 1 day    Time spent: > 35 minutes    Penny Pia, MD [facility] Pager [phone]  If 7PM-7AM, please contact night-coverage [email or web address] Password [facility] 11/04/2016, 4:27 PM

## 2016-11-04 NOTE — Progress Notes (Signed)
Initial Nutrition Assessment  DOCUMENTATION CODES:   Severe malnutrition in context of chronic illness  INTERVENTION:  - Provide Unjury TID, each supplement provides 100 calories and 21 grams protein - Provide ProStat BID, each supplement provides 100 calories and 15 grams protein  NUTRITION DIAGNOSIS:   Malnutrition (severe) related to chronic illness (s/p ischemic stroke) as evidenced by severe depletion of body fat, severe depletion of muscle mass, percent weight loss (24% in less than 6 months).  GOAL:   Patient will meet greater than or equal to 90% of their needs  MONITOR:   PO intake, Supplement acceptance, Labs, Weight trends  REASON FOR ASSESSMENT:   Consult Assessment of nutrition requirement/status  ASSESSMENT:   Pt with PMH of DM, HTN, and stroke in April 2018 presents with N/V and weakness found acute kidney injury   Pt reports poor PO intake over the past two weeks. Reporting only consuming bites and sips or less due to nausea and vomiting. Per chart, pt has had no significant PO intake since admission.   Pt denies constipation or diarrhea at this time.   Pt reports significant weight loss. Pt reports a UBW of 180 lbs, stating he weighed this in April. Per chart everywhere, pt's weight at [facility] was 177 lbs on  05/27/16. This is 24% weight loss in less than 6 months, significant for time frame.    Boost Breeze at bedside, pt reports enjoying this nutritional supplement. Pt amenable to nutritional supplementation this admission.   Labs reviewed; CBG 68-163, K 3.3 Medications reviewed; sliding scale insulin, potassium chloride  Nutrition focused physical exam completed. Findings include moderate to severe fat, moderate to severe muscle, and no edema  Diet Order:  Diet Carb Modified Fluid consistency: Thin; Room service appropriate? Yes  Skin:  Reviewed, no issues  Last BM:  Unknown BM Date   Height:   Ht Readings from Last 1 Encounters:  11/04/16   (1.676 m)    Weight:   Wt Readings from Last 1 Encounters:  11/04/16 135 lb 2.3 oz (61.3 kg)    Ideal Body Weight:  64.5 kg  BMI:  Body mass index is 21.81 kg/m.  Estimated Nutritional Needs:   Kcal:  1800-2000  Protein:  80-95 grams  Fluid:  >/= 1.8 L/d  EDUCATION NEEDS:   Education needs no appropriate at this time  Fransisca Kaufmann, MS, RDN, LDN 11/04/2016 1:17 PM

## 2016-11-04 NOTE — Progress Notes (Signed)
Pt's fluid intake has improved a great deal today. He states not feeling nauseated at all now.

## 2016-11-04 NOTE — Evaluation (Signed)
Clinical/Bedside Swallow Evaluation Patient Details  Name: Taylor Scott MRN: [id number] Date of Birth: 1950/04/23  Today's Date: 11/04/2016 Time: SLP Start Time (ACUTE ONLY): 1030 SLP Stop Time (ACUTE ONLY): 1052 SLP Time Calculation (min) (ACUTE ONLY): 22 min  Past Medical History:  Past Medical History:  Diagnosis Date  . Diabetes mellitus without complication (HCC)   . Stroke [facility])    Past Surgical History: History reviewed. No pertinent surgical history. HPI:  66 yo male adm to [facility] on Sept 15 with nausea/vomiting.  Pt reports problems with vomiting with intake of solids x3 weeks but does not recall if it was a sudden onset.  He furhter states he has not taken his po medications in two weeks due to his nausea/vomiting.  CT abdomen showed colonic stool burden.   PMH + for DM, left internal capsule CVA, HTN, prior smoker.  Pt denies dysphagia after his stroke this Spring.     Assessment / Plan / Recommendation Clinical Impression  Pt with mild right facial asymmetry but no other cranial nerve deficits impacting swallow during oral motor exam.   SLP observed pt consuming water and Boost Breeze via straw with good tolerance.  NO indication of aspiration or oropharyngeal dysphagia observed.  SLP did not test pt with solids as he is adamant about not having problems with foods sticking in throat/esophagus.  Pt reports problems with vomiting and not dysphagia.  He states he can get food/drinks down but solids come back up.  Question if pt's stool burden may be worsening nausea symptoms.  Advised RN that pt does not glucerna but does not mind Boost Breeze.  Encouraged pt to continue po medications as he tolerated his po medication yesterday.  Also advised he eat something before medication to decrease risk of nausea.  Recommend continue diet as tolerated.     SLP Visit Diagnosis: Dysphagia, unspecified (R13.10)    Aspiration Risk  Mild aspiration risk    Diet Recommendation Regular    Compensations: Slow rate;Small sips/bites Postural Changes: Seated upright at 90 degrees;Remain upright for at least 30 minutes after po intake    Other  Recommendations Oral Care Recommendations: Oral care BID   Follow up Recommendations        Frequency and Duration     n/a       Prognosis   n/a     Swallow Study   General Date of Onset: 11/04/16 HPI: 66 yo male adm to [facility] on Sept 15 with nausea/vomiting.  Pt reports problems with vomiting with intake of solids x3 weeks but does not recall if it was a sudden onset.  He furhter states he has not taken his po medications in two weeks due to his nausea/vomiting.  CT abdomen showed colonic stool burden.   PMH + for DM, left internal capsule CVA, HTN, prior smoker.  Pt denies dysphagia after his stroke this Spring.   Type of Study: Bedside Swallow Evaluation Diet Prior to this Study: Regular;Thin liquids Temperature Spikes Noted: No Respiratory Status: Room air History of Recent Intubation: No Behavior/Cognition: Alert;Cooperative;Pleasant mood Oral Cavity Assessment: Within Functional Limits Oral Care Completed by SLP: No Oral Cavity - Dentition: Adequate natural dentition Vision: Functional for self-feeding Self-Feeding Abilities: Able to feed self Patient Positioning: Upright in bed Baseline Vocal Quality: Normal Volitional Cough: Strong (pt reported chest pain with cough) Volitional Swallow: Able to elicit    Oral/Motor/Sensory Function Overall Oral Motor/Sensory Function:  (minimal right facial asymmetry)   Ice Chips Ice chips: Not  tested   Thin Liquid Thin Liquid: Within functional limits Presentation: Self Fed;Straw    Nectar Thick Nectar Thick Liquid: Not tested   Honey Thick Honey Thick Liquid: Not tested   Puree Puree: Not tested   Solid   GO   Solid: Not tested        Chales Abrahams 11/04/2016,11:18 AM  Donavan Burnet, MS [facility] SLP [phone]

## 2016-11-05 LAB — BASIC METABOLIC PANEL
Anion gap: 5 (ref 5–15)
BUN: 17 mg/dL (ref 6–20)
CALCIUM: 9.3 mg/dL (ref 8.9–10.3)
CO2: 24 mmol/L (ref 22–32)
CREATININE: 0.92 mg/dL (ref 0.61–1.24)
Chloride: 110 mmol/L (ref 101–111)
GFR calc Af Amer: >60 mL/min (ref >60)
Glucose, Bld: 111 mg/dL — ABNORMAL HIGH (ref 65–99)
Potassium: 3.6 mmol/L (ref 3.5–5.1)
SODIUM: 139 mmol/L (ref 135–145)

## 2016-11-05 LAB — GLUCOSE, CAPILLARY
GLUCOSE-CAPILLARY: 96 mg/dL (ref 65–99)
Glucose-Capillary: 135 mg/dL — ABNORMAL HIGH (ref 65–99)
Glucose-Capillary: 72 mg/dL (ref 65–99)
Glucose-Capillary: 108 mg/dL — ABNORMAL HIGH (ref 65–99)

## 2016-11-05 MED ORDER — MINERAL OIL RE ENEM
1.0000 | ENEMA | Freq: Once | RECTAL | Status: DC
  Filled 2016-11-05: qty 1

## 2016-11-05 NOTE — Progress Notes (Signed)
PROGRESS NOTE    Paxson Harrower  WGN:[id number] DOB: 10-26-1950 DOA: 11/02/2016 PCP: Patient, No Pcp Per    Brief Narrative:  66 y.o. 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  Patient has not been eating or drinking all that well. Reported not having bowel movements for 3 weeks. Administering enema today   Assessment & Plan:   Principal Problem:   Acute kidney injury (HCC) -Resolved with IV fluid rehydration most likely secondary to prerenal etiology.  Nursing patient's fluid intake is improved  Active Problems: Hypokalemia - Resolved after oral replacement    History of completed stroke - Continue statin - CT scan negative for stroke    Essential hypertension    Nausea & vomiting - Resolved continue treating supportively - Patient reports difficulty keeping food down. Not likely secondary to viral gastritis etiology given no reports of diarrhea or fevers. I wonder whether this is secondary to constipation. - Speech therapy consulted and set patient was mild aspiration risk after their evaluation.  Constipation - Patient reports not having bowel movement in 3 weeks. He denies having a bowel movement despite obtaining senna and Dulcolax. As such will administer enema    Prostate irregularity   Enlarged prostate   Loss of appetite - Agree with dietitian's evaluation, continue nutritional supplementation   DVT prophylaxis:/Heparin Code Status: Full Family Communication: Case discussed with daughter the day before yesterday Disposition Plan: Once patient is eating and drinking well may consider discharging.   Consultants:   None   Procedures: None   Antimicrobials: None   Subjective: Patient still reports poor oral intake, although nursing states that his fluid  intake is improved. He admitted to me today that he has not had a bowel movement in 3 weeks.  Objective: Vitals:   11/05/16 0438 11/05/16 0840 11/05/16 1320 11/05/16 1400  BP: 122/90 127/72  123/77  Pulse: (!) 50 (!) 44 60   Resp: Temp: 98.4 F (36.9 C) 98.2 F (36.8 C) 98.1 F (36.7 C)   TempSrc: Oral Oral Oral   SpO2: 100% 100% 100%   Weight: 61.3 kg (135 lb 1.6 oz)     Height:        Intake/Output Summary (Last 24 hours) at 11/05/16 1830 Last data filed at 11/05/16 1440  Gross per 24 hour  Intake             1080 ml  Output              900 ml  Net              180 ml   Filed Weights   11/03/16 0300 11/04/16 0500 11/05/16 0438  Weight: 61.3 kg (135 lb 2.3 oz) 61.3 kg (135 lb 2.3 oz) 61.3 kg (135 lb 1.6 oz)    Examination: Exam unchanged when compared to 11/04/2016  General exam: Appears calm and comfortable, in nad. Respiratory system: Clear to auscultation. Respiratory effort normal. Cardiovascular system: S1 & S2 heard, RRR. No JVD, murmurs, rubs, gallops or clicks.  Gastrointestinal system: Abdomen is nondistended, soft and nontender. No organomegaly or masses felt. Normal bowel sounds heard. Central nervous system: Alert and awake, no facial asymmetry. Answers questions appropriately Extremities: warm + pulses Skin: No rashes, lesions or ulcers, on limited exam. Psychiatry: flat affect     Data  Reviewed: I have personally reviewed following labs and imaging studies  CBC:  Recent Labs Lab 11/02/16 2116  WBC 8.2  HGB 14.6  HCT 39.5  MCV 80.1  PLT 282   Basic Metabolic Panel:  Recent Labs Lab 11/02/16 2116 11/03/16 0354 11/04/16 0406 11/05/16 0006  NA 138 140 139 139  K 3.5 3.6 3.3* 3.6  CL 97* 105 107 110  CO2 GLUCOSE 118* 93 125* 111*  BUN 28* 27* 18 17  CREATININE 1.64* 1.36* 1.20 0.92  CALCIUM 11.1* 9.5 9.3 9.3   GFR: Estimated Creatinine Clearance: 70.3 mL/min (by C-G formula based on SCr of 0.92 mg/dL). Liver  Function Tests:  Recent Labs Lab 11/02/16 2116 11/03/16 0354  AST 17 13*  ALT 17 14*  ALKPHOS 88 68  BILITOT 1.3* 0.8  PROT 8.5* 6.6  ALBUMIN 4.5 3.8    Recent Labs Lab 11/02/16 2116  LIPASE 29   No results for input(s): AMMONIA in the last 168 hours. Coagulation Profile: No results for input(s): INR, PROTIME in the last 168 hours. Cardiac Enzymes: No results for input(s): CKTOTAL, CKMB, CKMBINDEX, TROPONINI in the last 168 hours. BNP (last 3 results) No results for input(s): PROBNP in the last 8760 hours. HbA1C: No results for input(s): HGBA1C in the last 72 hours. CBG:  Recent Labs Lab 11/04/16 1734 11/04/16 2216 11/05/16 0733 11/05/16 1131 11/05/16 1717  GLUCAP 103* 83 96 135* 72   Lipid Profile: No results for input(s): CHOL, HDL, LDLCALC, TRIG, CHOLHDL, LDLDIRECT in the last 72 hours. Thyroid Function Tests: No results for input(s): TSH, T4TOTAL, FREET4, T3FREE, THYROIDAB in the last 72 hours. Anemia Panel: No results for input(s): VITAMINB12, FOLATE, FERRITIN, TIBC, IRON, RETICCTPCT in the last 72 hours. Sepsis Labs:  Recent Labs Lab 11/02/16 2302  LATICACIDVEN 1.89    No results found for this or any previous visit (from the past 240 hour(s)).       Radiology Studies: No results found.   Scheduled Meds: . atorvastatin  80 mg Oral q1800  . feeding supplement (PRO-STAT SUGAR FREE 64)  30 mL Oral BID  . heparin  5,000 Units Subcutaneous Q8H  . insulin aspart  0-9 Units Subcutaneous TID WC  . mineral oil  1 enema Rectal Once  . potassium chloride  20 mEq Oral BID  . protein supplement  2 oz Oral TID   Continuous Infusions:   LOS: 2 days    Time spent: > 35 minutes    Penny Pia, MD [facility] Pager [phone]  If 7PM-7AM, please contact night-coverage [email or web address] Password [email or web address] 11/05/2016, 6:30 PM

## 2016-11-06 DIAGNOSIS — E860 Dehydration: Secondary | ICD-10-CM

## 2016-11-06 DIAGNOSIS — K5900 Constipation, unspecified: Secondary | ICD-10-CM

## 2016-11-06 LAB — GLUCOSE, CAPILLARY
GLUCOSE-CAPILLARY: 83 mg/dL (ref 65–99)
Glucose-Capillary: 133 mg/dL — ABNORMAL HIGH (ref 65–99)

## 2016-11-06 MED ORDER — POTASSIUM CHLORIDE CRYS ER 20 MEQ PO TBCR: 20.0000 meq | EXTENDED_RELEASE_TABLET | Freq: Every day | ORAL | 0 refills | Status: AC

## 2016-11-06 MED ORDER — PRO-STAT SUGAR FREE PO LIQD: 30.0000 mL | Freq: Two times a day (BID) | ORAL | 0 refills | Status: AC

## 2016-11-06 MED ORDER — BISACODYL 5 MG PO TBEC: 5.0000 mg | DELAYED_RELEASE_TABLET | Freq: Every day | ORAL | 0 refills | Status: AC | PRN

## 2016-11-06 NOTE — Discharge Summary (Signed)
Physician Discharge Summary  Taylor Scott JXB:[id number] DOB: Feb 17, 1951 DOA: 11/02/2016  PCP: Patient, No Pcp Per  Admit date: 11/02/2016 Discharge date: 11/06/2016  Admitted From: Home Discharge disposition: Home   Recommendations for Outpatient Follow-Up:   1. Follow-up with PCP in one week for assessment of constipation and evaluation of enlarged prostate.   Discharge Diagnosis:   Principal Problem:   Acute kidney injury (HCC) Active Problems:   History of completed stroke   Essential hypertension   Nausea & vomiting   Prostate irregularity   AKI (acute kidney injury) (HCC)   Enlarged prostate   Loss of appetite  Discharge Condition: Improved.  Diet recommendation: Low sodium, heart healthy.    History of Present Illness:   66 year old male admitted on 11/02/16 for evaluation of severe constipation associated with nausea, vomiting and generalized weakness.   Hospital Course by Problem:   Principal Problem:   Acute kidney injury (HCC) Resolved with hydration. Felt to be prerenal in etiology.  Active Problems:   History of completed stroke CT of the head negative for recurrent stroke. Continue risk factor modification including blood pressure and lipid control.    Essential hypertension Continue losartan.    Nausea & vomiting Resolved with supportive care.    Prostate irregularity/enlarged prostate Recommend follow-up with PCP.    Loss of appetite Appetite improved at discharge. Provided with nutritional supplementation while in the hospital.   Medical Consultants:    None.   Discharge Exam:   Vitals:   11/05/16 1934 11/06/16 0447  BP: 127/78 116/82  Pulse: (!) 58 (!) 54  Resp: 16 16  Temp: 98.6 F (37 C) 98.4 F (36.9 C)  SpO2: 99% 100%   Vitals:   11/05/16 1320 11/05/16 1400 11/05/16 1934 11/06/16 0447  BP:  123/77 127/78 116/82  Pulse: 60  (!) 58 (!) 54  Resp: Temp: 98.1 F (36.7 C)  98.6 F (37 C) 98.4 F (36.9  C)  TempSrc: Oral  Oral Oral  SpO2: 100%  99% 100%  Weight:      Height:        General exam: Appears calm and comfortable.  Respiratory system: Clear to auscultation. Respiratory effort normal. Cardiovascular system: S1 & S2 heard, RRR. No JVD,  rubs, gallops or clicks. No murmurs. Gastrointestinal system: Abdomen is nondistended, soft and nontender. No organomegaly or masses felt. Normal bowel sounds heard. Central nervous system: Alert and oriented. No focal neurological deficits. Extremities: No clubbing,  or cyanosis. No edema. Skin: No rashes, lesions or ulcers. Psychiatry: Judgement and insight appear normal. Mood & affect appropriate.    The results of significant diagnostics from this hospitalization (including imaging, microbiology, ancillary and laboratory) are listed below for reference.     Procedures and Diagnostic Studies:   Ct Head Wo Contrast  Result Date: 11/03/2016 CLINICAL DATA:  Vomiting, weakness and confusion. EXAM: CT HEAD WITHOUT CONTRAST TECHNIQUE: Contiguous axial images were obtained from the base of the skull through the vertex without intravenous contrast. COMPARISON:  Head CT 08/21/2016.  MRI 05/20/2016. FINDINGS: Brain: No acute finding. The brainstem and cerebellum are normal by CT. 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 There chronic small-vessel ischemic changes of the hemispheric white matter. No large vessel territory infarction. No mass lesion, hemorrhage, hydrocephalus or extra-axial collection. Vascular: Mild calcification of the carotid siphons. Skull: Normal Sinuses/Orbits: Clear/normal Other: None IMPRESSION: No acute findinThadius Smisekd small vessel infarctions affecting the basal  ganglia bilaterally. Chronic small-vessel changes of the white matter. Electronically Signed   By: Paulina Fusi M.D.   On: 11/03/2016 14:08   Ct Abdomen Pelvis W Contrast  Result Date: 11/02/2016 CLINICAL DATA:  Nausea and  vomiting. Lower abdominal tenderness. No bowel movement for 3 weeks. Weight loss. EXAM: CT ABDOMEN AND PELVIS WITH CONTRAST TECHNIQUE: Multidetector CT imaging of the abdomen and pelvis was performed using the standard protocol following bolus administration of intravenous contrast. CONTRAST:  75 cc Isovue 300 IV COMPARISON:  None. FINDINGS: Lower chest: The lung bases are clear. No pleural fluid or consolidation. No pulmonary nodule. Hepatobiliary: No focal liver abnormality is seen. No gallstones, gallbladder wall thickening, or biliary dilatation. Pancreas: No ductal dilatation or inflammation. No evidence of pancreatic mass, mild motion artifact through the head and neck. Spleen: Normal in size without focal abnormality. Adrenals/Urinary Tract: Normal adrenal glands without nodule. No hydronephrosis or perinephric edema. There small subcentimeter hypodense lesions in both kidneys that are too small for accurate characterization about the likely simple cysts. No suspicious features. Urinary bladder is minimally distended, mild thick wall. Stomach/Bowel: Lack of enteric contrast and paucity of intra-abdominal fat limits bowel assessment. No gastric wall thickening. No small bowel inflammation or obstruction. Normal appendix. Moderate colonic stool burden in the ascending, transverse, and descending colon. Tortuous sigmoid colon with small volume of stool. No colonic wall thickening. No evidence of dominant colonic mass allowing for lack of contrast. Vascular/Lymphatic: No significant vascular findings are present. No enlarged abdominal or pelvic lymph nodes. Reproductive: Heterogeneous enlarged prostate gland spanning 5.8 x 5.1 x 5.3 cm there is mass effect on the bladder base. Other: No ascites.  No free air.  No intra-abdominal abscess. Musculoskeletal: No acute osseous abnormality. No focal bone lesion. IMPRESSION: 1. No bowel obstruction or inflammation. Moderate colonic stool burden. 2. Heterogeneous enlarged  prostate gland causing mass effect on the bladder base. Recommend correlation with PSA. Mild bladder wall thickening is likely chronic. 3. There is otherwise no evidence of intra-abdominal malignancy. 4. Subcentimeter bilateral renal cysts. Electronically Signed   By: Rubye Oaks M.D.   On: 11/02/2016 23:30     Labs:   Basic Metabolic Panel:  Recent Labs Lab 11/02/16 2116 11/03/16 0354 11/04/16 0406 11/05/16 0006  NA 138 140 139 139  K 3.5 3.6 3.3* 3.6  CL 97* 105 107 110  CO2 GLUCOSE 118* 93 125* 111*  BUN 28* 27* 18 17  CREATININE 1.64* 1.36* 1.20 0.92  CALCIUM 11.1* 9.5 9.3 9.3   GFR Estimated Creatinine Clearance: 70.3 mL/min (by C-G formula based on SCr of 0.92 mg/dL). Liver Function Tests:  Recent Labs Lab 11/02/16 2116 11/03/16 0354  AST 17 13*  ALT 17 14*  ALKPHOS 88 68  BILITOT 1.3* 0.8  PROT 8.5* 6.6  ALBUMIN 4.5 3.8    Recent Labs Lab 11/02/16 2116  LIPASE 29   No results for input(s): AMMONIA in the last 168 hours. Coagulation profile No results for input(s): INR, PROTIME in the last 168 hours.  CBC:  Recent Labs Lab 11/02/16 2116  WBC 8.2  HGB 14.6  HCT 39.5  MCV 80.1  PLT 282   Cardiac Enzymes: No results for input(s): CKTOTAL, CKMB, CKMBINDEX, TROPONINI in the last 168 hours. BNP: Invalid input(s): POCBNP CBG:  Recent Labs Lab 11/05/16 1131 11/05/16 1717 11/05/16 2159 11/06/16 0807 11/06/16 1148  GLUCAP 135* 72 108* 83 133*   D-Dimer No results for input(s): DDIMER in the  last 72 hours. Hgb A1c No results for input(s): HGBA1C in the last 72 hours. Lipid Profile No results for input(s): CHOL, HDL, LDLCALC, TRIG, CHOLHDL, LDLDIRECT in the last 72 hours. Thyroid function studies No results for input(s): TSH, T4TOTAL, T3FREE, THYROIDAB in the last 72 hours.  Invalid input(s): FREET3 Anemia work up No results for input(s): VITAMINB12, FOLATE, FERRITIN, TIBC, IRON, RETICCTPCT in the last 72  hours. Microbiology No results found for this or any previous visit (from the past 240 hour(s)).   Discharge Instructions:   Discharge Instructions    Call MD for:  extreme fatigue    Complete by:  As directed    Call MD for:  persistant nausea and vomiting    Complete by:  As directed    Call MD for:  severe uncontrolled pain    Complete by:  As directed    Call MD for:  temperature >100.4    Complete by:  As directed    Diet - low sodium heart healthy    Complete by:  As directed    Increase activity slowly    Complete by:  As directed      Allergies as of 11/06/2016      Reactions   Lisinopril Anaphylaxis      Medication List    TAKE these medications   atorvastatin 80 MG tablet Commonly known as:  LIPITOR Take 80 mg by mouth daily.   bisacodyl 5 MG EC tablet Commonly known as:  DULCOLAX Take 1 tablet (5 mg total) by mouth daily as needed for moderate constipation.   feeding supplement (PRO-STAT SUGAR FREE 64) Liqd Take 30 mLs by mouth 2 (two) times daily.   losartan 25 MG tablet Commonly known as:  COZAAR Take 25 mg by mouth daily.   potassium chloride SA 20 MEQ tablet Commonly known as:  K-DUR,KLOR-CON Take 1 tablet (20 mEq total) by mouth daily.            Discharge Care Instructions        Start     Ordered   11/06/16 0000  bisacodyl (DULCOLAX) 5 MG EC tablet  Daily PRN     11/06/16 1102   11/06/16 0000  potassium chloride SA (K-DUR,KLOR-CON) 20 MEQ tablet  Daily     11/06/16 1102   11/06/16 0000  Amino Acids-Protein Hydrolys (FEEDING SUPPLEMENT, PRO-STAT SUGAR FREE 64,) LIQD  2 times daily     11/06/16 1102   11/06/16 0000  Increase activity slowly     11/06/16 1102   11/06/16 0000  Diet - low sodium heart healthy     11/06/16 1102   11/06/16 0000  Call MD for:  temperature >100.4     11/06/16 1102   11/06/16 0000  Call MD for:  persistant nausea and vomiting     11/06/16 1102   11/06/16 0000  Call MD for:  severe uncontrolled pain      11/06/16 1102   11/06/16 0000  Call MD for:  extreme fatigue     11/06/16 1102     Follow-up Information    Sessoms, Lawrence Marseilles, MD. Schedule an appointment as soon as possible for a visit in 1 week(s).   Specialty:  Internal Medicine Why:  Hospital follow up. Contact information: [address] [facility] [place] [address] [phone]            Time coordinating discharge: 25 minutes  Signed:  Deauna Yaw  Pager [phone] [facility] 11/06/2016, 8:48  PM

## 2016-11-06 NOTE — Discharge Instructions (Signed)

## 2018-02-15 IMAGING — CT CT ABD-PELV W/ CM
2 of 5 series · 15 of 46 positions shown, 17 images · IV contrast (ISOVUE)
Comparison: None.

CLINICAL DATA: Nausea and vomiting. Lower abdominal tenderness. No
bowel movement for 3 weeks. Weight loss.

EXAM:
CT ABDOMEN AND PELVIS WITH CONTRAST
TECHNIQUE: Multidetector CT imaging of the abdomen and pelvis was performed
using the standard protocol following bolus administration of
intravenous contrast.
CONTRAST:  75 cc Isovue 300 IV

[Series 2: abd/pel with · axial · 0.78mm/px · z∈[+1099,+1499]mm · 12 of 92 slices shown, 14 images]
[im 6/92  soft-tissue]
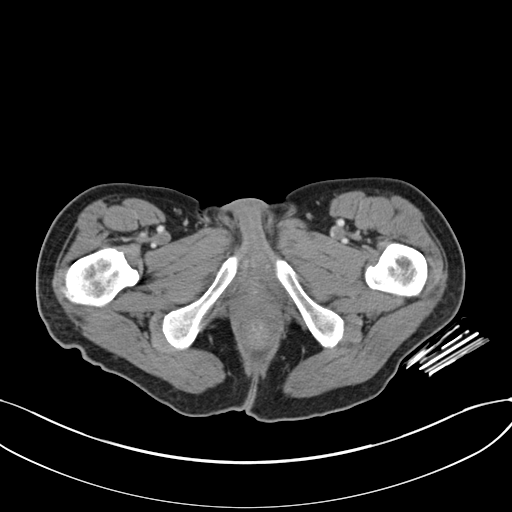
[im 6/92  bone]
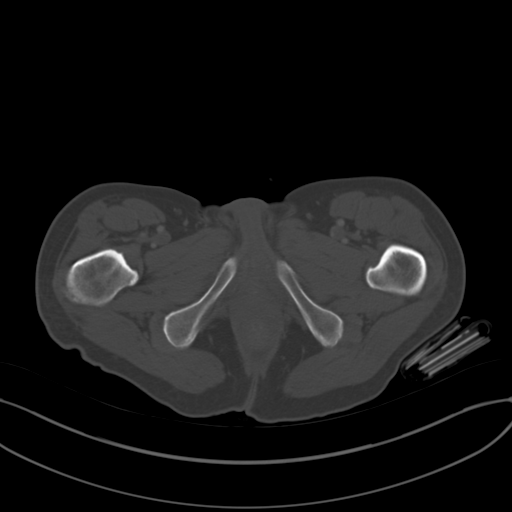
[im 12/92  soft-tissue]
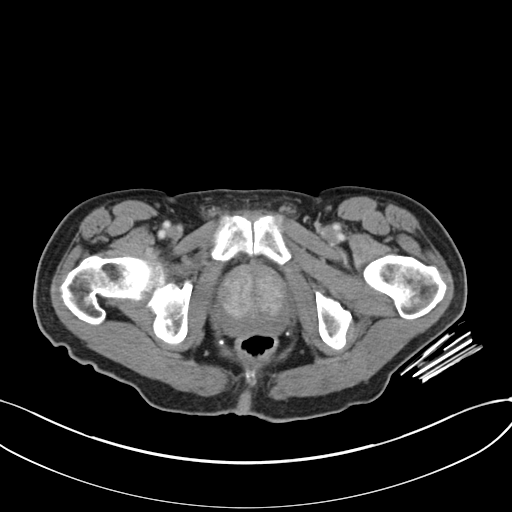
[im 23/92  soft-tissue]
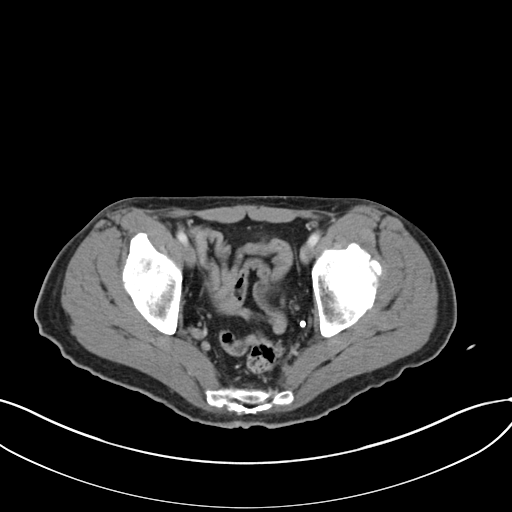
[im 29/92  soft-tissue]
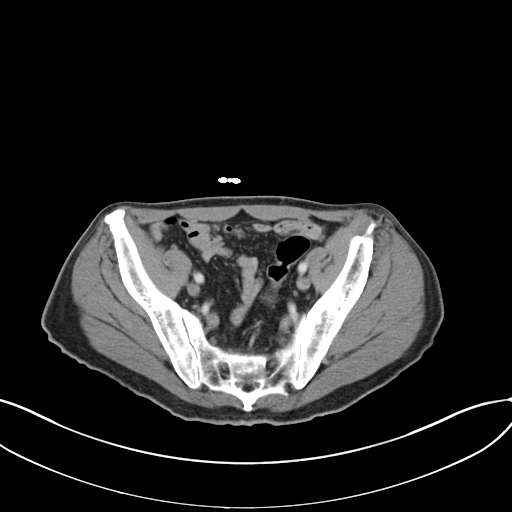
[im 35/92  soft-tissue]
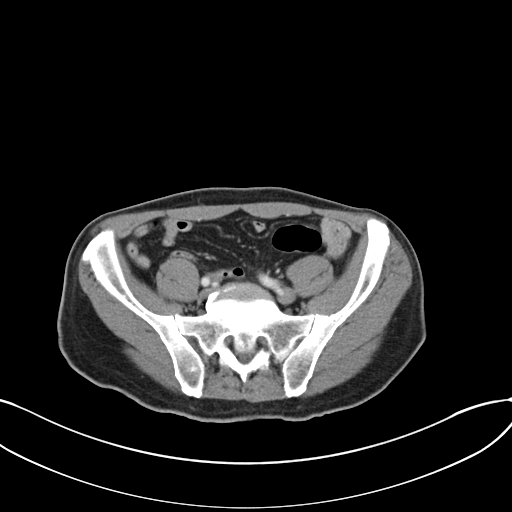
[im 40/92  soft-tissue]
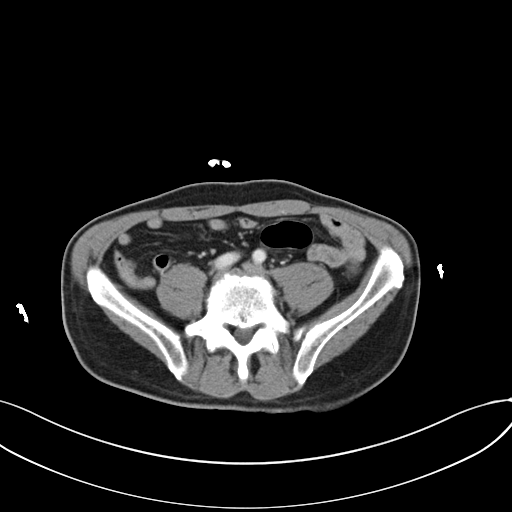
[im 52/92  soft-tissue]
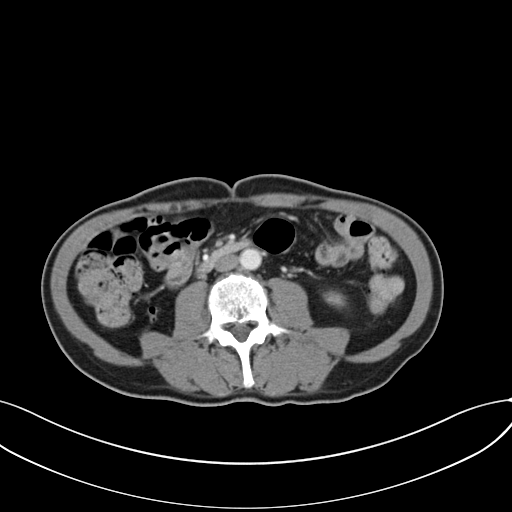
[im 57/92  soft-tissue]
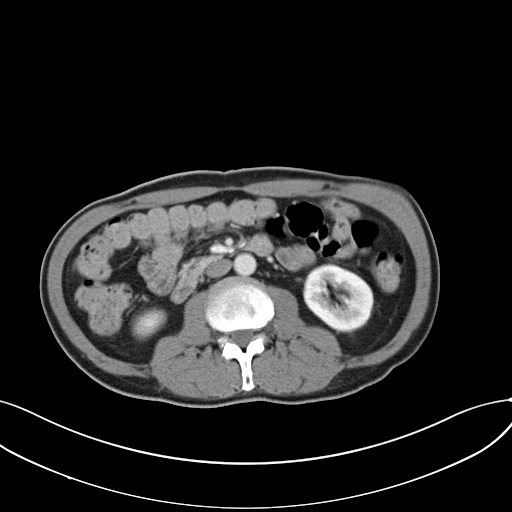
[im 63/92  soft-tissue]
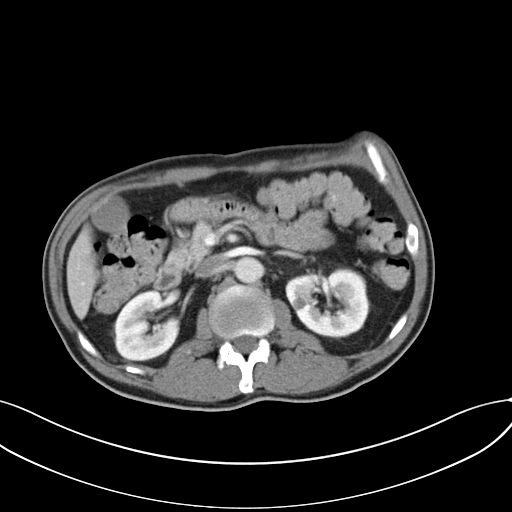
[im 63/92  bone]
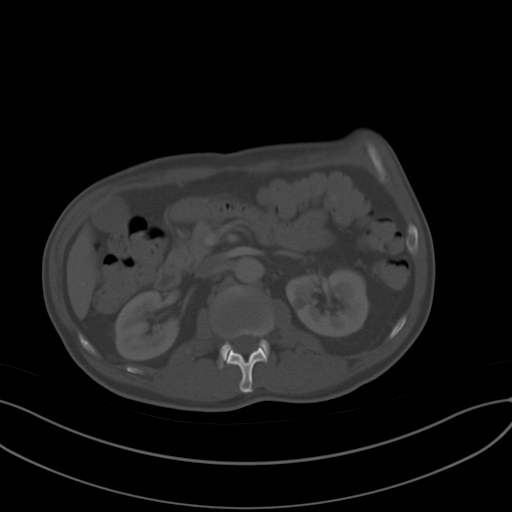
[im 69/92  soft-tissue]
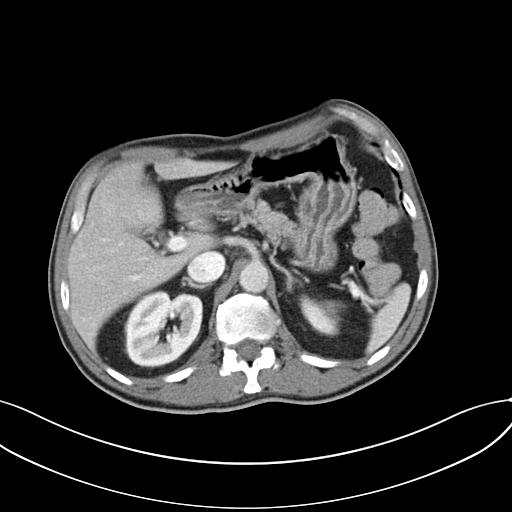
[im 80/92  soft-tissue]
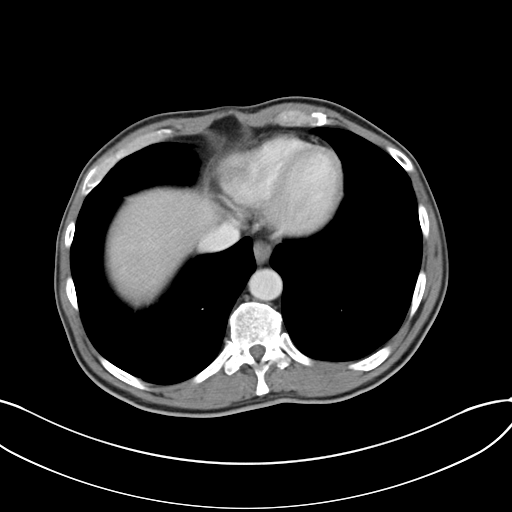
[im 86/92  soft-tissue]
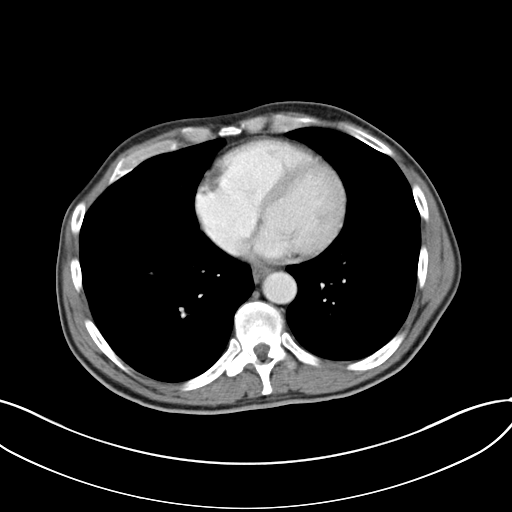

[Series 4: coronal a/|p · coronal · 0.74mm/px · 3 of 129 slices shown]
[im 43/129  soft-tissue]
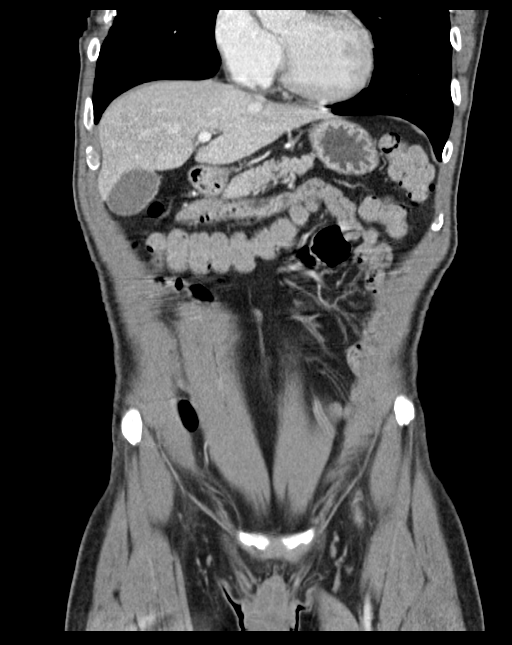
[im 57/129  soft-tissue]
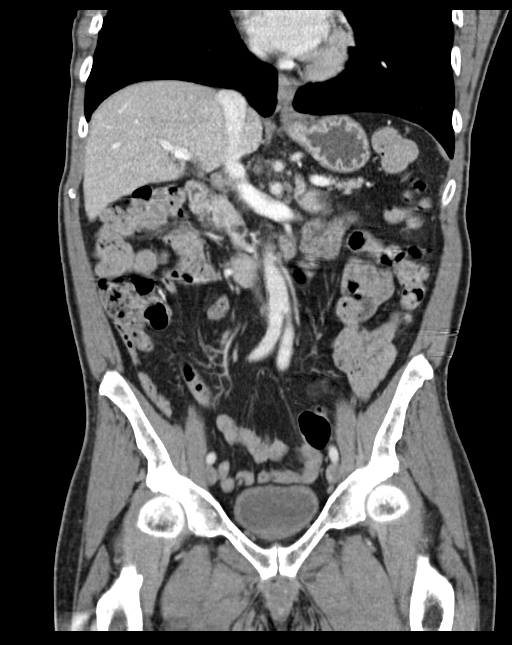
[im 72/129  soft-tissue]
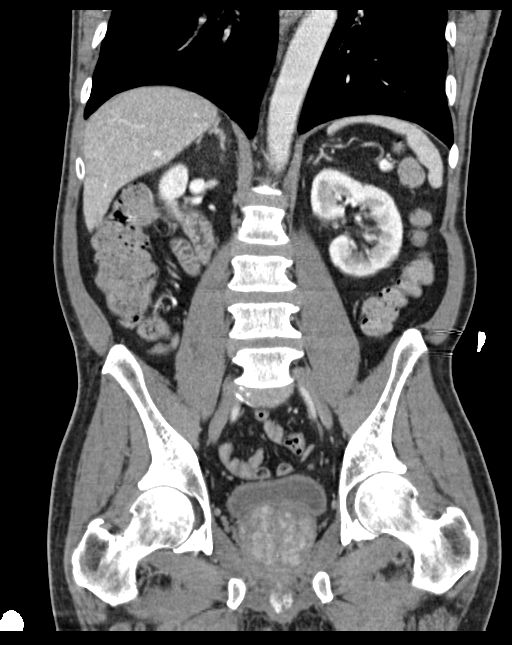

[15 of 46 positions shown; findings below may reference images not displayed]

FINDINGS: Lower chest: The lung bases are clear. No pleural fluid or
consolidation. No pulmonary nodule.

Hepatobiliary: No focal liver abnormality is seen. No gallstones,
gallbladder wall thickening, or biliary dilatation.

Pancreas: No ductal dilatation or inflammation. No evidence of
pancreatic mass, mild motion artifact through the head and neck.

Spleen: Normal in size without focal abnormality.

Adrenals/Urinary Tract: Normal adrenal glands without nodule. No
hydronephrosis or perinephric edema. There small subcentimeter
hypodense lesions in both kidneys that are too small for accurate
characterization about the likely simple cysts. No suspicious
features. Urinary bladder is minimally distended, mild thick wall.

Stomach/Bowel: Lack of enteric contrast and paucity of
intra-abdominal fat limits bowel assessment. No gastric wall
thickening. No small bowel inflammation or obstruction. Normal
appendix. Moderate colonic stool burden in the ascending,
transverse, and descending colon. Tortuous sigmoid colon with small
volume of stool. No colonic wall thickening. No evidence of dominant
colonic mass allowing for lack of contrast.

Vascular/Lymphatic: No significant vascular findings are present. No
enlarged abdominal or pelvic lymph nodes.

Reproductive: Heterogeneous enlarged prostate gland spanning 5.8 x
5.1 x 5.3 cm there is mass effect on the bladder base.

Other: No ascites.  No free air.  No intra-abdominal abscess.

Musculoskeletal: No acute osseous abnormality. No focal bone lesion.
IMPRESSION: 1. No bowel obstruction or inflammation. Moderate colonic stool
burden.
2. Heterogeneous enlarged prostate gland causing mass effect on the
bladder base. Recommend correlation with PSA. Mild bladder wall
thickening is likely chronic.
3. There is otherwise no evidence of intra-abdominal malignancy.
4. Subcentimeter bilateral renal cysts.

## 2020-02-02 ENCOUNTER — Other Ambulatory Visit: Payer: Self-pay | Admitting: Urology

## 2020-02-02 DIAGNOSIS — R972 Elevated prostate specific antigen [PSA]: Secondary | ICD-10-CM
